# Patient Record
Sex: Female | Born: 1975 | Race: White | Hispanic: No | Marital: Married | Smoking: Current every day smoker
Health system: Southern US, Community
[De-identification: ages and names within clinical notes are randomized; demographics above are authoritative.]

## PROBLEM LIST (undated history)

## (undated) DIAGNOSIS — H538 Other visual disturbances: Secondary | ICD-10-CM

## (undated) DIAGNOSIS — C801 Malignant (primary) neoplasm, unspecified: Secondary | ICD-10-CM

## (undated) DIAGNOSIS — R0602 Shortness of breath: Secondary | ICD-10-CM

## (undated) DIAGNOSIS — G479 Sleep disorder, unspecified: Secondary | ICD-10-CM

## (undated) DIAGNOSIS — R4189 Other symptoms and signs involving cognitive functions and awareness: Secondary | ICD-10-CM

## (undated) DIAGNOSIS — R0600 Dyspnea, unspecified: Secondary | ICD-10-CM

## (undated) DIAGNOSIS — N63 Unspecified lump in unspecified breast: Secondary | ICD-10-CM

## (undated) DIAGNOSIS — M25511 Pain in right shoulder: Secondary | ICD-10-CM

## (undated) DIAGNOSIS — G43411 Hemiplegic migraine, intractable, with status migrainosus: Secondary | ICD-10-CM

## (undated) DIAGNOSIS — L680 Hirsutism: Secondary | ICD-10-CM

## (undated) DIAGNOSIS — R002 Palpitations: Secondary | ICD-10-CM

## (undated) DIAGNOSIS — R5383 Other fatigue: Secondary | ICD-10-CM

## (undated) DIAGNOSIS — R6889 Other general symptoms and signs: Secondary | ICD-10-CM

## (undated) DIAGNOSIS — H53453 Other localized visual field defect, bilateral: Secondary | ICD-10-CM

## (undated) DIAGNOSIS — G43E11 Chronic migraine with aura, intractable, with status migrainosus: Secondary | ICD-10-CM

## (undated) DIAGNOSIS — M7581 Other shoulder lesions, right shoulder: Secondary | ICD-10-CM

## (undated) DIAGNOSIS — M7501 Adhesive capsulitis of right shoulder: Secondary | ICD-10-CM

## (undated) HISTORY — PX: LAPAROSCOPIC GASTRIC SLEEVE RESECTION: SHX5895

## (undated) HISTORY — PX: ABDOMINAL HYSTERECTOMY: SHX81

## (undated) HISTORY — PX: ABDOMINAL SURGERY: SHX537

---

## 2006-07-22 ENCOUNTER — Ambulatory Visit: Payer: Self-pay | Admitting: Gastroenterology

## 2007-07-02 ENCOUNTER — Ambulatory Visit: Payer: Self-pay | Admitting: Family Medicine

## 2007-07-08 ENCOUNTER — Ambulatory Visit: Payer: Self-pay | Admitting: Family Medicine

## 2007-10-08 ENCOUNTER — Ambulatory Visit: Payer: Self-pay | Admitting: Obstetrics and Gynecology

## 2008-01-29 ENCOUNTER — Emergency Department: Payer: Self-pay | Admitting: Emergency Medicine

## 2008-10-13 ENCOUNTER — Ambulatory Visit: Payer: Self-pay | Admitting: Neurology

## 2009-06-08 ENCOUNTER — Ambulatory Visit: Payer: Self-pay | Admitting: Urology

## 2012-05-21 ENCOUNTER — Ambulatory Visit: Payer: Self-pay | Admitting: Internal Medicine

## 2014-08-25 ENCOUNTER — Other Ambulatory Visit: Payer: Self-pay | Admitting: Obstetrics and Gynecology

## 2014-08-25 DIAGNOSIS — Z1231 Encounter for screening mammogram for malignant neoplasm of breast: Secondary | ICD-10-CM

## 2014-09-02 ENCOUNTER — Ambulatory Visit
Admission: RE | Admit: 2014-09-02 | Discharge: 2014-09-02 | Disposition: A | Discharge status: Home / Self Care | Payer: BLUE CROSS/BLUE SHIELD | Source: Ambulatory Visit | Attending: Obstetrics and Gynecology | Admitting: Obstetrics and Gynecology

## 2014-09-02 DIAGNOSIS — Z1231 Encounter for screening mammogram for malignant neoplasm of breast: Secondary | ICD-10-CM | POA: Insufficient documentation

## 2017-06-20 ENCOUNTER — Other Ambulatory Visit: Payer: Self-pay

## 2017-06-20 ENCOUNTER — Encounter: Payer: Self-pay | Admitting: *Deleted

## 2017-06-20 ENCOUNTER — Emergency Department
Admission: EM | Admit: 2017-06-20 | Discharge: 2017-06-20 | Disposition: A | Discharge status: Home / Self Care | Payer: BLUE CROSS/BLUE SHIELD | Attending: Emergency Medicine | Admitting: Emergency Medicine

## 2017-06-20 ENCOUNTER — Emergency Department: Payer: BLUE CROSS/BLUE SHIELD

## 2017-06-20 DIAGNOSIS — F17210 Nicotine dependence, cigarettes, uncomplicated: Secondary | ICD-10-CM | POA: Diagnosis not present

## 2017-06-20 DIAGNOSIS — R079 Chest pain, unspecified: Secondary | ICD-10-CM | POA: Diagnosis present

## 2017-06-20 DIAGNOSIS — M79601 Pain in right arm: Secondary | ICD-10-CM | POA: Diagnosis not present

## 2017-06-20 LAB — BASIC METABOLIC PANEL
Anion gap: 7 (ref 5–15)
BUN: 12 mg/dL (ref 6–20)
CALCIUM: 8.7 mg/dL — AB (ref 8.9–10.3)
CO2: 23 mmol/L (ref 22–32)
CREATININE: 0.72 mg/dL (ref 0.44–1.00)
Chloride: 109 mmol/L (ref 101–111)
GFR calc Af Amer: >60 mL/min (ref >60)
GFR calc non Af Amer: >60 mL/min (ref >60)
GLUCOSE: 91 mg/dL (ref 65–99)
Potassium: 4.2 mmol/L (ref 3.5–5.1)
SODIUM: 139 mmol/L (ref 135–145)

## 2017-06-20 LAB — CBC
HCT: 42.5 % (ref 35.0–47.0)
Hemoglobin: 14.5 g/dL (ref 12.0–16.0)
MCH: 33.2 pg (ref 26.0–34.0)
MCHC: 34 g/dL (ref 32.0–36.0)
MCV: 97.5 fL (ref 80.0–100.0)
PLATELETS: 233 10*3/uL (ref 150–440)
RBC: 4.36 MIL/uL (ref 3.80–5.20)
RDW: 12.7 % (ref 11.5–14.5)
WBC: 7 10*3/uL (ref 3.6–11.0)

## 2017-06-20 LAB — TROPONIN I: Troponin I: <0.03 ng/mL (ref <0.03)

## 2017-06-20 MED ORDER — KETOROLAC TROMETHAMINE 10 MG PO TABS
10.0000 mg | ORAL_TABLET | Freq: Once | ORAL | Status: AC
  Administered 2017-06-20: 10 mg via ORAL
  Filled 2017-06-20: qty 1

## 2017-06-20 NOTE — Discharge Instructions (Addendum)
For your chest pain, you may take Tylenol or Motrin.  In addition, you may use ice or heat that you apply for 15 minutes every 2 hours when you are awake.  Please return to the emergency department if you develop severe pain, lightheadedness or fainting, nausea or vomiting, fever, or any other symptoms concerning to you.

## 2017-06-20 NOTE — ED Triage Notes (Signed)
Pt reports chest pain starting at work. Starting w/ R arm pain that radiated to chest. Pt c/o presently of R sided headache and dizziness. Pt states this has progressed throughout the day. 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 Pt states she has had a "cold" for several weeks. Pt is in no acute distress at this time but is slightly tachypnic intermittently.

## 2017-06-20 NOTE — ED Provider Notes (Signed)
[facility] Emergency Department Provider Note  ____________________________________________  Time seen: Approximately 4:17 PM  I have reviewed the triage vital signs and the nursing notes.   HISTORY  Chief Complaint Chest Pain    HPI Faith Hall is a 42 y.o. female ongoing tobacco abuse presenting with chest pain.  The patient reports that she was at work sitting at a desk when she developed a mid forearm "throbbing" which then radiated up to the remainder of the right arm and the right chest and became sharp.  The pain is better if she pushes on her chest.  It is not worse with deep breaths and she has no associated shortness of breath, palpitations, diaphoresis, lightheadedness or syncope, nausea or vomiting.  She did not try anything for her pain.  She has not recently had any strain or trauma.  No lower extremity swelling or calf pain although she recently did travel to [place].  No personal or family history of blood clots.  SH: As of tobacco, denies cocaine.  FH: Grandparents with stroke and MI.  History reviewed. No pertinent past medical history.  There are no active problems to display for this patient.   Past Surgical History:  Procedure Laterality Date  . ABDOMINAL HYSTERECTOMY    . ABDOMINAL SURGERY        Allergies Amoxicillin and Penicillins  Family History  Problem Relation Age of Onset  . Breast cancer Maternal Grandmother 45  . Lung cancer Maternal Grandfather 36  . Breast cancer Paternal Grandmother 57  . Ovarian cancer Paternal Grandmother 110    Social History Social History   Tobacco Use  . Smoking status: Current Every Day Smoker    Types: Cigarettes  . Smokeless tobacco: Never Used  Substance Use Topics  . Alcohol use: Not Currently    Frequency: Never    Comment: occasionally  . Drug use: Never    Review of Systems Constitutional: No fever/chills.  No lightheadedness or syncope.  No diaphoresis.  No general  malaise. Eyes: No visual changes.  No eye discharge. ENT: No sore throat. No congestion or rhinorrhea. Cardiovascular: Positive right-sided chest pain. Denies palpitations. Respiratory: Denies shortness of breath.  No cough. Gastrointestinal: No abdominal pain.  No nausea, no vomiting.  No diarrhea.  No constipation. Genitourinary: Negative for dysuria. Musculoskeletal: Negative for back pain.  Positive right upper extremity pain.  No lower extremity swelling or calf pain. Skin: Negative for rash. Neurological: Negative for headaches. No focal numbness, tingling or weakness.     ____________________________________________   PHYSICAL EXAM:  VITAL SIGNS: ED Triage Vitals  Enc Vitals Group     BP 06/20/17 1521 116/71     Pulse Rate 06/20/17 1521 82     Resp 06/20/17 1521 (!) 23     Temp 06/20/17 1521 98.8 F (37.1 C)     Temp Source 06/20/17 1521 Oral     SpO2 06/20/17 1521 99 %     Weight 06/20/17 1524 125 lb (56.7 kg)     Height 06/20/17 1524 5\' 5"  (1.651 m)     Head Circumference --      Peak Flow --      Pain Score 06/20/17 1522 6     Pain Loc --      Pain Edu? --      Excl. in [place]? --     Constitutional: Alert and oriented. Well appearing and in no acute distress. Answers questions appropriately. Eyes: Conjunctivae are normal.  EOMI.  No scleral icterus. Head: Atraumatic. Nose: No congestion/rhinnorhea. Mouth/Throat: Mucous membranes are moist.  Neck: No stridor.  Supple.  No JVD.  No meningismus. CHEST WALL: The chest wall is normal in appearance without any skin changes.  I am able to "ease" the pain the patient is experiencing when I push on the right upper chest approximately 2-3 inches below the clavicle.  There is no rib instability or crepitus. Cardiovascular: Normal rate, regular rhythm. No murmurs, rubs or gallops.  Respiratory: Normal respiratory effort.  No accessory muscle use or retractions. Lungs CTAB.  No wheezes, rales or ronchi. Gastrointestinal: Soft,  nontender and nondistended.  No guarding or rebound.  No peritoneal signs. Musculoskeletal: No LE edema. No ttp in the calves or palpable cords.  Negative Homan's sign. Neurologic:  A&Ox3.  Speech is clear.  Face and smile are symmetric.  EOMI.  Moves all extremities well. Skin:  Skin is warm, dry and intact. No rash noted. Psychiatric: Mood and affect are normal. Speech and behavior are normal.  Normal judgement.  ____________________________________________   LABS (all labs ordered are listed, but only abnormal results are displayed)  Labs Reviewed  BASIC METABOLIC PANEL - Abnormal; Notable for the following components:      Result Value   Calcium 8.7 (*)    All other components within normal limits  CBC  TROPONIN I  POC URINE PREG, ED   ____________________________________________  EKG  ED ECG REPORT I, Eula Listen, the attending physician, personally viewed and interpreted this ECG.   Date: 06/20/2017  EKG Time: 1520  Rate: 82  Rhythm: normal sinus rhythm  Axis: normal  Intervals:none  ST&T Change: Specific T wave inversion in V1.  No STEMI.  No evidence of Brugada syndrome, hypertrophy, or prolonged QTC  ____________________________________________  RADIOLOGY  Dg Chest 2 View  Result Date: 06/20/2017 CLINICAL DATA:  Chest pain. EXAM: CHEST - 2 VIEW COMPARISON:  None. FINDINGS: The heart size and mediastinal contours are within normal limits. Both lungs are clear. The visualized skeletal structures are unremarkable. IMPRESSION: Normal chest x-ray. Electronically Signed   By: Titus Dubin M.D.   On: 06/20/2017 15:58    ____________________________________________   PROCEDURES  Procedure(s) performed: None  Procedures  Critical Care performed: No ____________________________________________   INITIAL IMPRESSION / ASSESSMENT AND PLAN / ED COURSE  Pertinent labs & imaging results that were available during my care of the patient were reviewed by  me and considered in my medical decision making (see chart for details).  42 y.o. female with right arm pain and right-sided chest pain which started around 9 AM today, and improves if she pushes on her chest.  Overall, the patient is hemodynamically stable.  This chest pain is atypical for ACS or MI in her workup in the emergency department has been reassuring with an EKG that does not show any ischemic changes and normal vital signs.  I am awaiting the results of her troponin, which will be drawn well after the 4-hour mark from the initiation of symptoms.  We will treat the patient with Toradol.  A PE is considered but very unlikely given no tachycardia, hypoxia, evidence of DVT, although the patient is noted to be a smoker and have had some recent travel.  A GI cause is also possible.  If the patient's laboratory studies are reassuring, anticipate discharge home with close PMD follow-up.  ____________________________________________  FINAL CLINICAL IMPRESSION(S) / ED DIAGNOSES  Final diagnoses:  Right-sided chest pain  Right arm pain  NEW MEDICATIONS STARTED DURING THIS VISIT:  New Prescriptions   No medications on file      Eula Listen, MD 06/20/17 1726

## 2017-08-23 ENCOUNTER — Other Ambulatory Visit: Payer: Self-pay | Admitting: Obstetrics and Gynecology

## 2017-08-23 DIAGNOSIS — Z1231 Encounter for screening mammogram for malignant neoplasm of breast: Secondary | ICD-10-CM

## 2017-08-28 ENCOUNTER — Ambulatory Visit
Admission: RE | Admit: 2017-08-28 | Discharge: 2017-08-28 | Disposition: A | Discharge status: Home / Self Care | Payer: BLUE CROSS/BLUE SHIELD | Source: Ambulatory Visit | Attending: Obstetrics and Gynecology | Admitting: Obstetrics and Gynecology

## 2017-08-28 ENCOUNTER — Encounter (INDEPENDENT_AMBULATORY_CARE_PROVIDER_SITE_OTHER): Payer: Self-pay

## 2017-08-28 DIAGNOSIS — Z1231 Encounter for screening mammogram for malignant neoplasm of breast: Secondary | ICD-10-CM | POA: Insufficient documentation

## 2017-08-28 HISTORY — DX: Malignant (primary) neoplasm, unspecified: C80.1

## 2018-09-09 ENCOUNTER — Other Ambulatory Visit: Payer: Self-pay | Admitting: Obstetrics and Gynecology

## 2018-09-09 DIAGNOSIS — Z1231 Encounter for screening mammogram for malignant neoplasm of breast: Secondary | ICD-10-CM

## 2018-09-10 ENCOUNTER — Other Ambulatory Visit: Payer: Self-pay

## 2018-09-10 ENCOUNTER — Ambulatory Visit
Admission: RE | Admit: 2018-09-10 | Discharge: 2018-09-10 | Disposition: A | Discharge status: Home / Self Care | Payer: BC Managed Care – PPO | Source: Ambulatory Visit | Attending: Obstetrics and Gynecology | Admitting: Obstetrics and Gynecology

## 2018-09-10 DIAGNOSIS — Z1231 Encounter for screening mammogram for malignant neoplasm of breast: Secondary | ICD-10-CM | POA: Diagnosis present

## 2019-05-27 ENCOUNTER — Other Ambulatory Visit: Payer: Self-pay | Admitting: Neurology

## 2019-05-27 ENCOUNTER — Other Ambulatory Visit (HOSPITAL_COMMUNITY): Payer: Self-pay | Admitting: Neurology

## 2019-05-27 DIAGNOSIS — H539 Unspecified visual disturbance: Secondary | ICD-10-CM

## 2019-05-27 DIAGNOSIS — R413 Other amnesia: Secondary | ICD-10-CM

## 2019-05-27 DIAGNOSIS — R519 Headache, unspecified: Secondary | ICD-10-CM

## 2019-06-03 ENCOUNTER — Ambulatory Visit
Admission: RE | Admit: 2019-06-03 | Discharge: 2019-06-03 | Disposition: A | Discharge status: Home / Self Care | Payer: BC Managed Care – PPO | Source: Ambulatory Visit | Attending: Neurology | Admitting: Neurology

## 2019-06-03 ENCOUNTER — Other Ambulatory Visit: Payer: Self-pay

## 2019-06-03 DIAGNOSIS — R519 Headache, unspecified: Secondary | ICD-10-CM | POA: Insufficient documentation

## 2019-06-03 DIAGNOSIS — H539 Unspecified visual disturbance: Secondary | ICD-10-CM | POA: Diagnosis present

## 2019-06-03 DIAGNOSIS — R413 Other amnesia: Secondary | ICD-10-CM

## 2019-06-25 ENCOUNTER — Ambulatory Visit: Payer: BC Managed Care – PPO | Attending: Internal Medicine

## 2019-06-25 DIAGNOSIS — Z23 Encounter for immunization: Secondary | ICD-10-CM

## 2019-06-25 NOTE — Progress Notes (Signed)
   Covid-19 Vaccination Clinic  Name:  Faith Hall    MRN: [id number] DOB: 1975/11/19  06/25/2019  [name] was observed post Covid-19 immunization for 15 minutes without incident. She was provided with Vaccine Information Sheet and instruction to access the V-Safe system.   [name] was instructed to call 911 with any severe reactions post vaccine: Marland Kitchen Difficulty breathing  . Swelling of face and throat  . A fast heartbeat  . A bad rash all over body  . Dizziness and weakness   Immunizations Administered    Name Date Dose VIS Date Route   Pfizer COVID-19 Vaccine 06/25/2019 11:14 AM 0.3 mL 03/06/2019 Intramuscular   Manufacturer: [place]   Lot: [id number]   [place]: [id number]

## 2019-07-21 ENCOUNTER — Ambulatory Visit: Payer: BC Managed Care – PPO | Attending: Internal Medicine

## 2019-07-21 DIAGNOSIS — Z23 Encounter for immunization: Secondary | ICD-10-CM

## 2019-07-21 NOTE — Progress Notes (Signed)
   Covid-19 Vaccination Clinic  Name:  Faith Hall    MRN: [id number] DOB: 04-04-1975  07/21/2019  [name] was observed post Covid-19 immunization for 15 minutes without incident. She was provided with Vaccine Information Sheet and instruction to access the V-Safe system.   [name] was instructed to call 911 with any severe reactions post vaccine: Marland Kitchen Difficulty breathing  . Swelling of face and throat  . A fast heartbeat  . A bad rash all over body  . Dizziness and weakness   Immunizations Administered    Name Date Dose VIS Date Route   Pfizer COVID-19 Vaccine 07/21/2019  3:13 PM 0.3 mL 05/20/2018 Intramuscular   Manufacturer: [place]   Lot: [id number]   [place]: [id number]

## 2019-07-21 NOTE — Progress Notes (Signed)
   Covid-19 Vaccination Clinic  Name:  Faith Hall    MRN: [id number] DOB: 14-Sep-1975  07/21/2019  Faith Hall was observed post Covid-19 immunization for 15 minutes .  During the observation period, she experienced an adverse reaction with the following symptoms:  itching to face and neck.  Assessment : Time of assessment Alert and oriented, Anxious and itching to face and neck. No rash seen by this writer  Actions taken:  Vitals sign taken  VAERS form obtained and completed by RN. VS taken at 1532- BP- 129/85, P- 73, O2- 100%, Water given.    Medications administered: Loratadine (Claritin) 10 mg  by mouth. Time: 1532. Administered by RN. Famotidine (Pepcid) 20 mg by mouth. Time: 1532. Administered by RN.  Disposition: Reports no further symptoms of adverse reaction after observation for 40 minutes. Discharged home. Instructed to call 911 for trouble breathing, rapid heart rate, dizziness, swelling of tongue or throat. The Patient was provided with Vaccine Information Sheet and instruction to access the V-Safe system.    Immunizations Administered    Name Date Dose VIS Date Route   Pfizer COVID-19 Vaccine 07/21/2019  3:13 PM 0.3 mL 05/20/2018 Intramuscular   Manufacturer: [place]   Lot: [id number]   [place]: [id number]

## 2019-07-23 ENCOUNTER — Telehealth: Payer: Self-pay | Admitting: *Deleted

## 2019-07-23 ENCOUNTER — Encounter: Payer: Self-pay | Admitting: *Deleted

## 2019-07-23 NOTE — Telephone Encounter (Signed)
Called and left a voicemail for the patient asking for her to return call to discuss the second COVID vaccine that she received and to call back with an update. MyChart message sent. Will follow up.

## 2019-07-24 NOTE — Telephone Encounter (Signed)
Letter mailed

## 2019-12-30 ENCOUNTER — Other Ambulatory Visit (HOSPITAL_COMMUNITY): Payer: Self-pay | Admitting: Obstetrics and Gynecology

## 2019-12-30 DIAGNOSIS — Z1231 Encounter for screening mammogram for malignant neoplasm of breast: Secondary | ICD-10-CM

## 2020-01-07 ENCOUNTER — Other Ambulatory Visit: Payer: Self-pay

## 2020-01-07 ENCOUNTER — Ambulatory Visit (HOSPITAL_COMMUNITY): Payer: BC Managed Care – PPO

## 2020-01-07 ENCOUNTER — Other Ambulatory Visit: Payer: Self-pay | Admitting: Obstetrics and Gynecology

## 2020-01-07 ENCOUNTER — Ambulatory Visit
Admission: RE | Admit: 2020-01-07 | Discharge: 2020-01-07 | Disposition: A | Discharge status: Home / Self Care | Payer: BC Managed Care – PPO | Source: Ambulatory Visit | Attending: Obstetrics and Gynecology | Admitting: Obstetrics and Gynecology

## 2020-01-07 DIAGNOSIS — Z1231 Encounter for screening mammogram for malignant neoplasm of breast: Secondary | ICD-10-CM | POA: Insufficient documentation

## 2021-07-06 ENCOUNTER — Other Ambulatory Visit: Payer: Self-pay | Admitting: Obstetrics and Gynecology

## 2021-07-06 DIAGNOSIS — Z1231 Encounter for screening mammogram for malignant neoplasm of breast: Secondary | ICD-10-CM

## 2021-07-21 ENCOUNTER — Other Ambulatory Visit: Payer: Self-pay | Admitting: Physician Assistant

## 2021-07-21 DIAGNOSIS — R202 Paresthesia of skin: Secondary | ICD-10-CM

## 2021-07-21 DIAGNOSIS — R413 Other amnesia: Secondary | ICD-10-CM

## 2021-07-21 DIAGNOSIS — R531 Weakness: Secondary | ICD-10-CM

## 2021-07-21 DIAGNOSIS — R519 Headache, unspecified: Secondary | ICD-10-CM

## 2021-07-21 DIAGNOSIS — G43411 Hemiplegic migraine, intractable, with status migrainosus: Secondary | ICD-10-CM

## 2021-07-21 DIAGNOSIS — H539 Unspecified visual disturbance: Secondary | ICD-10-CM

## 2021-07-21 DIAGNOSIS — H538 Other visual disturbances: Secondary | ICD-10-CM

## 2021-08-09 ENCOUNTER — Ambulatory Visit
Admission: RE | Admit: 2021-08-09 | Discharge: 2021-08-09 | Disposition: A | Discharge status: Home / Self Care | Payer: BC Managed Care – PPO | Source: Ambulatory Visit | Attending: Obstetrics and Gynecology | Admitting: Obstetrics and Gynecology

## 2021-08-09 DIAGNOSIS — Z1231 Encounter for screening mammogram for malignant neoplasm of breast: Secondary | ICD-10-CM | POA: Insufficient documentation

## 2021-10-02 ENCOUNTER — Other Ambulatory Visit: Payer: Self-pay | Admitting: Physician Assistant

## 2021-10-02 DIAGNOSIS — R519 Headache, unspecified: Secondary | ICD-10-CM

## 2021-10-02 DIAGNOSIS — H53453 Other localized visual field defect, bilateral: Secondary | ICD-10-CM

## 2021-10-11 ENCOUNTER — Ambulatory Visit
Admission: RE | Admit: 2021-10-11 | Discharge: 2021-10-11 | Disposition: A | Discharge status: Home / Self Care | Payer: BC Managed Care – PPO | Source: Ambulatory Visit | Attending: Physician Assistant | Admitting: Physician Assistant

## 2021-10-11 DIAGNOSIS — H53453 Other localized visual field defect, bilateral: Secondary | ICD-10-CM | POA: Diagnosis present

## 2021-10-11 DIAGNOSIS — R519 Headache, unspecified: Secondary | ICD-10-CM | POA: Insufficient documentation

## 2021-10-11 MED ORDER — GADOBUTROL 1 MMOL/ML IV SOLN
5.0000 mL | Freq: Once | INTRAVENOUS | Status: AC | PRN
  Administered 2021-10-11: 5 mL via INTRAVENOUS

## 2022-05-31 ENCOUNTER — Other Ambulatory Visit: Payer: Self-pay | Admitting: Obstetrics and Gynecology

## 2022-05-31 DIAGNOSIS — N63 Unspecified lump in unspecified breast: Secondary | ICD-10-CM

## 2022-06-06 ENCOUNTER — Ambulatory Visit
Admission: RE | Admit: 2022-06-06 | Discharge: 2022-06-06 | Disposition: A | Discharge status: Home / Self Care | Payer: BC Managed Care – PPO | Source: Ambulatory Visit | Attending: Obstetrics and Gynecology | Admitting: Obstetrics and Gynecology

## 2022-06-06 DIAGNOSIS — N63 Unspecified lump in unspecified breast: Secondary | ICD-10-CM | POA: Diagnosis present

## 2022-08-21 ENCOUNTER — Ambulatory Visit: Payer: BC Managed Care – PPO

## 2022-08-21 DIAGNOSIS — Z1211 Encounter for screening for malignant neoplasm of colon: Secondary | ICD-10-CM

## 2022-08-21 DIAGNOSIS — K640 First degree hemorrhoids: Secondary | ICD-10-CM

## 2022-12-10 IMAGING — MG MM DIGITAL SCREENING BILAT W/ TOMO AND CAD
6 of 10 series · 6 of 30 positions shown · non-contrast
Comparison: Previous exam(s).

CLINICAL DATA: Screening.

EXAM:
DIGITAL SCREENING BILATERAL MAMMOGRAM WITH TOMOSYNTHESIS AND CAD
TECHNIQUE: Bilateral screening digital craniocaudal and mediolateral oblique
mammograms were obtained. Bilateral screening digital breast
tomosynthesis was performed. The images were evaluated with
computer-aided detection.

[R MLO synth-2D (1 of 2)]
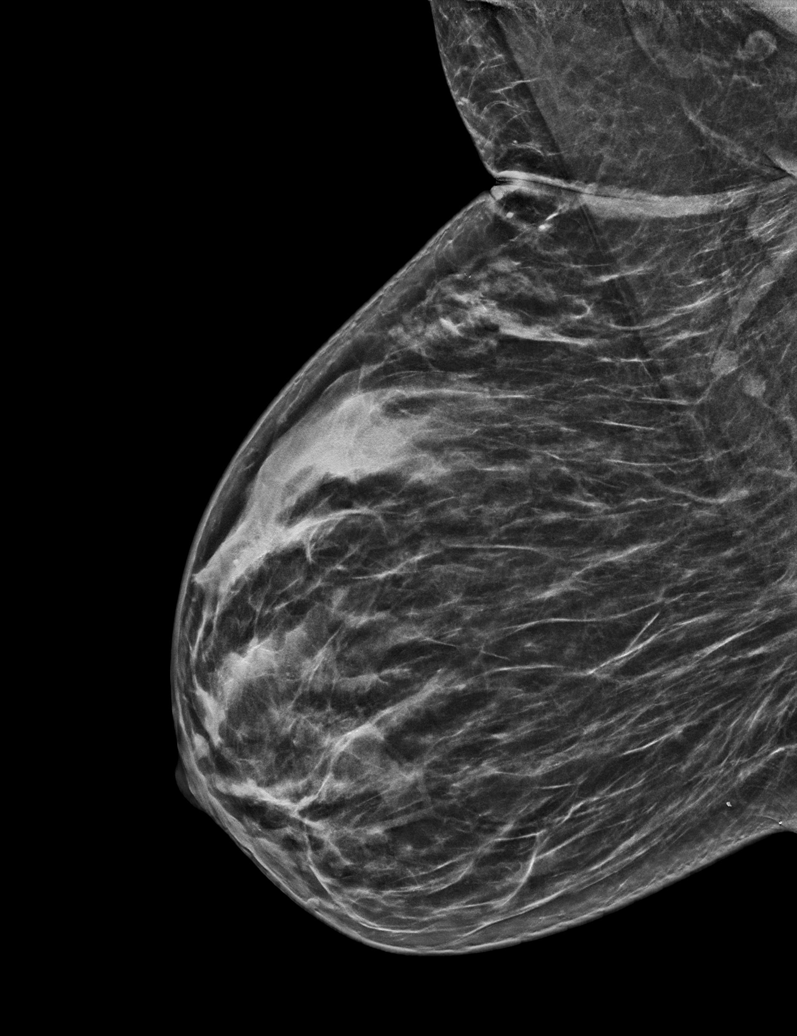

[R CC synth-2D]
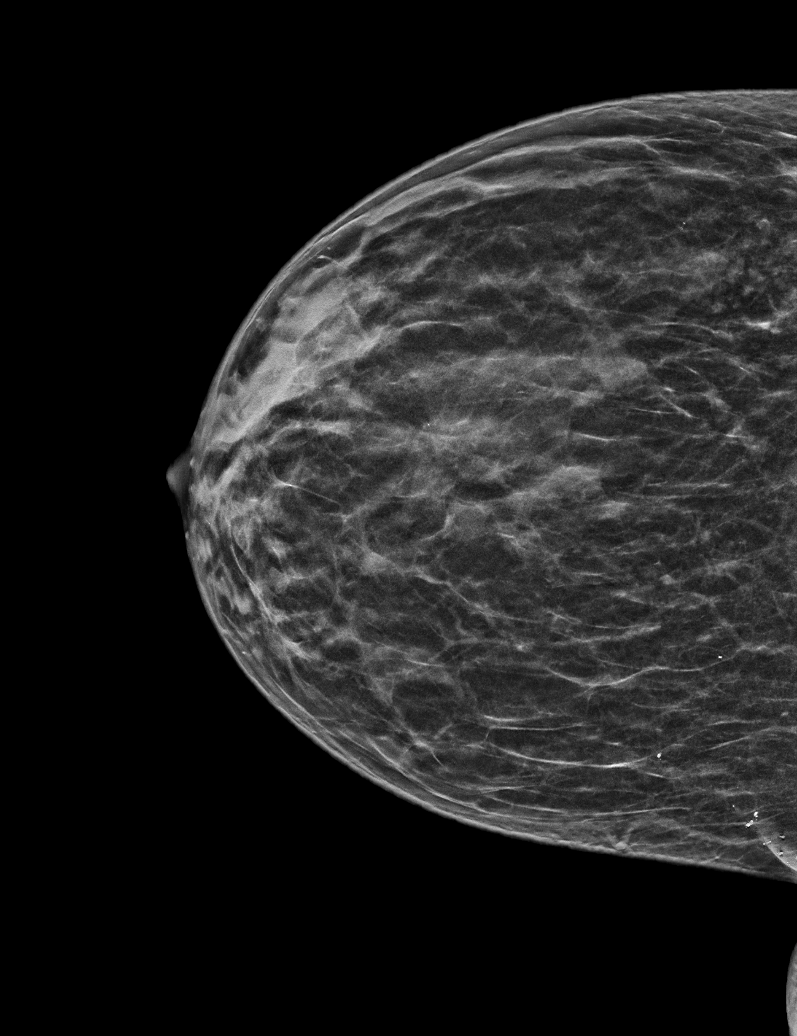

[L CC synth-2D]
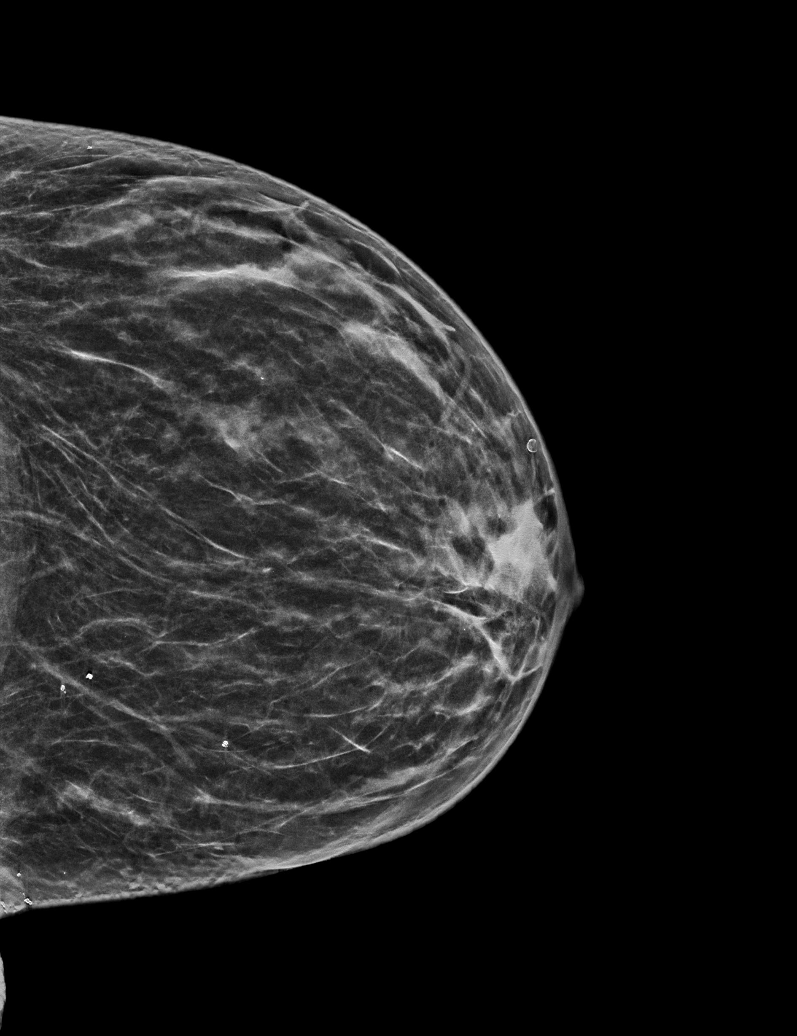

[R MLO synth-2D (2 of 2)]
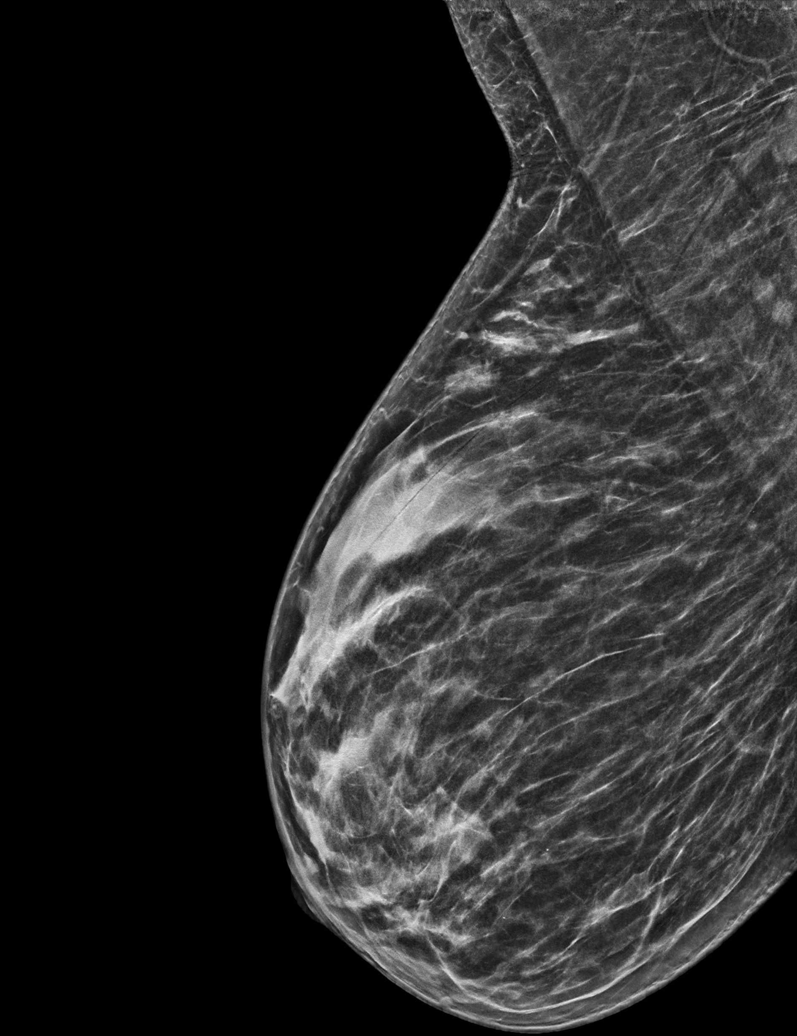

[L MLO synth-2D]
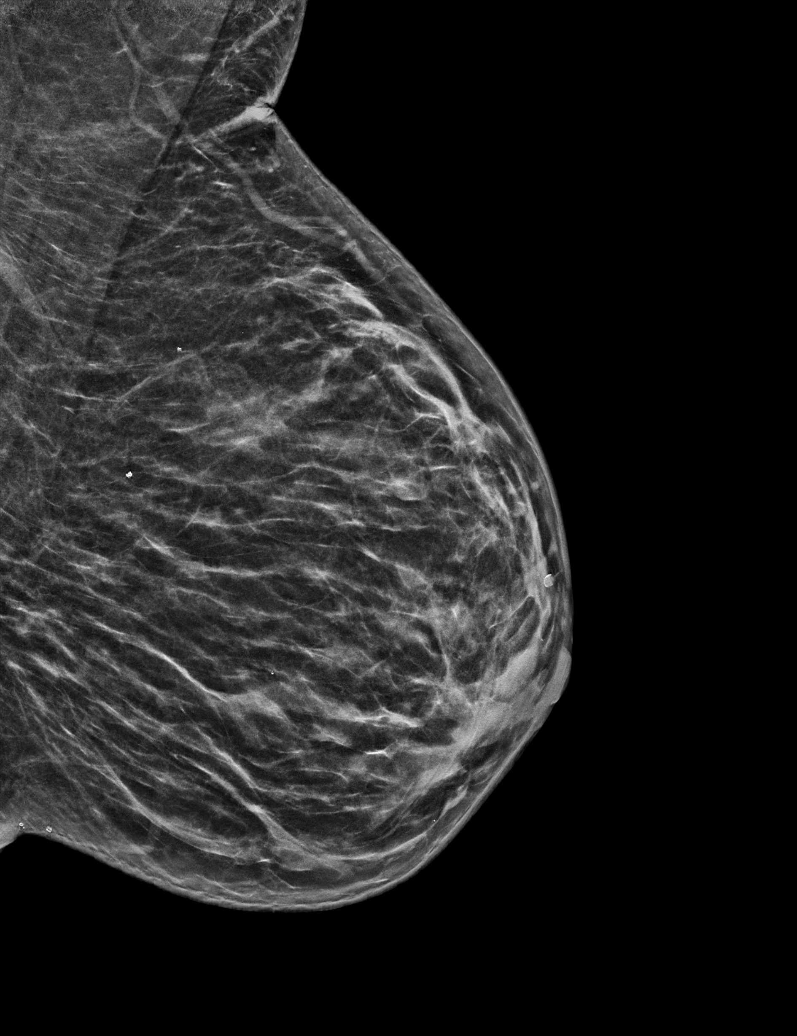

[R MLO tomo · tomo slice 28/55.0]
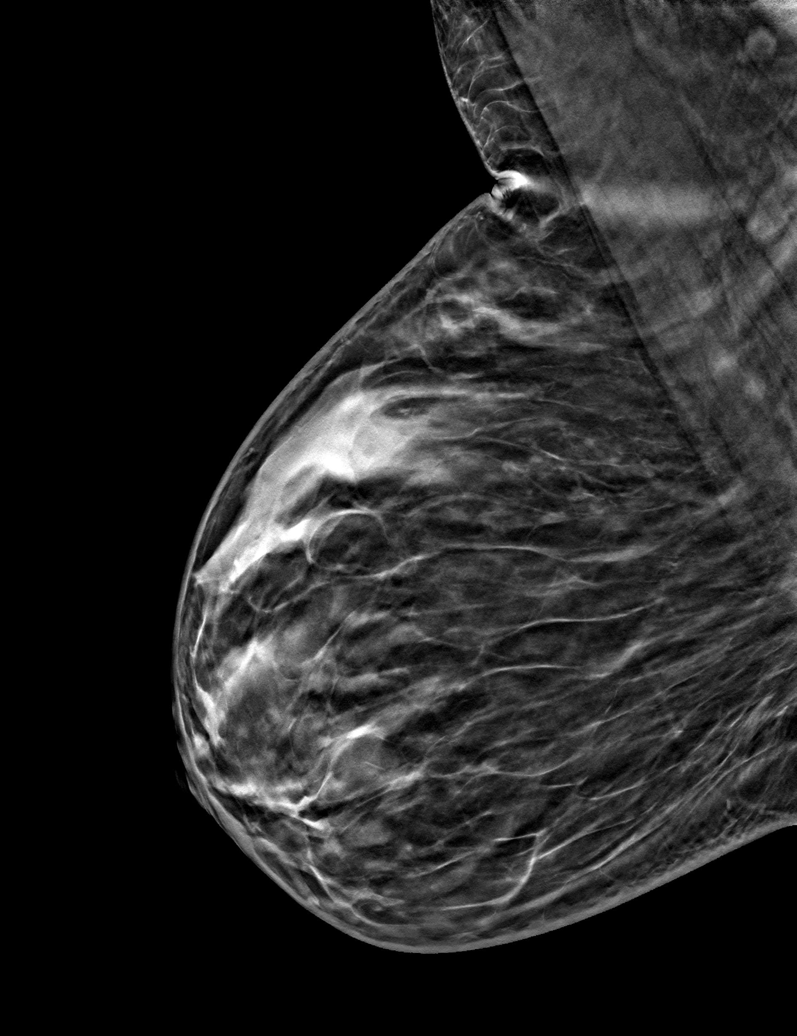

[6 of 30 positions shown; findings below may reference images not displayed]

ACR Breast Density Category c: The breast tissue is heterogeneously
dense, which may obscure small masses.
FINDINGS: There are no findings suspicious for malignancy.
IMPRESSION: No mammographic evidence of malignancy. A result letter of this
screening mammogram will be mailed directly to the patient.

RECOMMENDATION:
Screening mammogram in one year. (Code:[id number])

BI-RADS CATEGORY  1: Negative.

## 2023-02-06 ENCOUNTER — Other Ambulatory Visit: Payer: Self-pay | Admitting: Physician Assistant

## 2023-02-06 DIAGNOSIS — H539 Unspecified visual disturbance: Secondary | ICD-10-CM

## 2023-02-06 DIAGNOSIS — G43411 Hemiplegic migraine, intractable, with status migrainosus: Secondary | ICD-10-CM

## 2023-02-06 DIAGNOSIS — H538 Other visual disturbances: Secondary | ICD-10-CM

## 2023-02-06 DIAGNOSIS — R202 Paresthesia of skin: Secondary | ICD-10-CM

## 2023-02-06 DIAGNOSIS — H53453 Other localized visual field defect, bilateral: Secondary | ICD-10-CM

## 2023-02-26 ENCOUNTER — Ambulatory Visit
Admission: RE | Admit: 2023-02-26 | Discharge: 2023-02-26 | Disposition: A | Discharge status: Home / Self Care | Payer: BC Managed Care – PPO | Source: Ambulatory Visit | Attending: Physician Assistant | Admitting: Physician Assistant

## 2023-02-26 DIAGNOSIS — H539 Unspecified visual disturbance: Secondary | ICD-10-CM

## 2023-02-26 DIAGNOSIS — H53453 Other localized visual field defect, bilateral: Secondary | ICD-10-CM

## 2023-02-26 DIAGNOSIS — R202 Paresthesia of skin: Secondary | ICD-10-CM

## 2023-02-26 DIAGNOSIS — G43411 Hemiplegic migraine, intractable, with status migrainosus: Secondary | ICD-10-CM

## 2023-02-26 DIAGNOSIS — H538 Other visual disturbances: Secondary | ICD-10-CM

## 2023-07-23 ENCOUNTER — Other Ambulatory Visit: Payer: Self-pay | Admitting: Surgery

## 2023-07-26 ENCOUNTER — Encounter
Admission: RE | Admit: 2023-07-26 | Discharge: 2023-07-26 | Disposition: A | Discharge status: Home / Self Care | Source: Ambulatory Visit | Attending: Surgery

## 2023-07-26 ENCOUNTER — Other Ambulatory Visit: Payer: Self-pay

## 2023-07-26 HISTORY — DX: Other general symptoms and signs: R68.89

## 2023-07-26 HISTORY — DX: Hemiplegic migraine, intractable, with status migrainosus: G43.411

## 2023-07-26 HISTORY — DX: Hirsutism: L68.0

## 2023-07-26 HISTORY — DX: Sleep disorder, unspecified: G47.9

## 2023-07-26 HISTORY — DX: Other symptoms and signs involving cognitive functions and awareness: R41.89

## 2023-07-26 HISTORY — DX: Other shoulder lesions, right shoulder: M75.81

## 2023-07-26 HISTORY — DX: Pain in right shoulder: M25.511

## 2023-07-26 HISTORY — DX: Other localized visual field defect, bilateral: H53.453

## 2023-07-26 HISTORY — DX: Chronic migraine with aura, intractable, with status migrainosus: G43.E11

## 2023-07-26 HISTORY — DX: Unspecified lump in unspecified breast: N63.0

## 2023-07-26 HISTORY — DX: Other fatigue: R53.83

## 2023-07-26 HISTORY — DX: Adhesive capsulitis of right shoulder: M75.01

## 2023-07-26 HISTORY — DX: Palpitations: R00.2

## 2023-07-26 HISTORY — DX: Dyspnea, unspecified: R06.00

## 2023-07-26 HISTORY — DX: Shortness of breath: R06.02

## 2023-07-26 HISTORY — DX: Other visual disturbances: H53.8

## 2023-07-26 NOTE — Patient Instructions (Addendum)
 Your procedure is scheduled on: 07/31/23 - Wednesday Report to the Registration Desk on the 1st floor of the [facility]. To find out your arrival time, please call [phone] between 1PM - 3PM on: 07/30/23 - Tuesday If your arrival time is 6:00 am, do not arrive before that time as the [facility] entrance doors do not open until 6:00 am.  REMEMBER: Instructions that are not followed completely may result in serious medical risk, up to and including death; or upon the discretion of your surgeon and anesthesiologist your surgery may need to be rescheduled.  Do not eat food after midnight the night before surgery.  No gum chewing or hard candies.  You may however, drink CLEAR liquids up to 2 hours before you are scheduled to arrive for your surgery. Do not drink anything within 2 hours of your scheduled arrival time.  Clear liquids include: - water  - apple juice without pulp - gatorade (not RED colors) - black coffee or tea (Do NOT add milk or creamers to the coffee or tea) Do NOT drink anything that is not on this list.  In addition, your doctor has ordered for you to drink the provided:  Ensure Pre-Surgery Clear Carbohydrate Drink  Drinking this carbohydrate drink up to two hours before surgery helps to reduce insulin resistance and improve patient outcomes. Please complete drinking 2 hours before scheduled arrival time.  One week prior to surgery: Stop Anti-inflammatories (NSAIDS) such as Advil, Aleve, Ibuprofen, Motrin, Naproxen, Naprosyn and Aspirin based products such as Excedrin, Goody's Powder, BC Powder. You take Tylenol if needed for pain up until the day of surgery.  Stop ANY OVER THE COUNTER supplements until after surgery.  ON THE DAY OF SURGERY ONLY TAKE THESE MEDICATIONS WITH SIPS OF WATER:  Ubrogepant  topiramate (TOPAMAX)  gabapentin (NEURONTIN)  fluticasone (FLONASE)   No Alcohol for 24 hours before or after surgery.  No Smoking including e-cigarettes for  24 hours before surgery.  No chewable tobacco products for at least 6 hours before surgery.  No nicotine patches on the day of surgery.  Do not use any "recreational" drugs for at least a week (preferably 2 weeks) before your surgery.  Please be advised that the combination of cocaine and anesthesia may have negative outcomes, up to and including death. If you test positive for cocaine, your surgery will be cancelled.  On the morning of surgery brush your teeth with toothpaste and water, you may rinse your mouth with mouthwash if you wish. Do not swallow any toothpaste or mouthwash.  Use CHG Soap or wipes as directed on instruction sheet.  Do not wear jewelry, make-up, hairpins, clips or nail polish.  For welded (permanent) jewelry: bracelets, anklets, waist bands, etc.  Please have this removed prior to surgery.  If it is not removed, there is a chance that hospital personnel will need to cut it off on the day of surgery.  Do not wear lotions, powders, or perfumes.   Do not shave body hair from the neck down 48 hours before surgery.  Contact lenses, hearing aids and dentures may not be worn into surgery.  Do not bring valuables to the hospital. [facility] is not responsible for any missing/lost belongings or valuables.   Notify your doctor if there is any change in your medical condition (cold, fever, infection).  Wear comfortable clothing (specific to your surgery type) to the hospital.  After surgery, you can help prevent lung complications by doing breathing exercises.  Take deep breaths and cough every 1-2 hours. Your doctor may order a device called an Incentive Spirometer to help you take deep breaths.  When coughing or sneezing, hold a pillow firmly against your incision with both hands. This is called "splinting." Doing this helps protect your incision. It also decreases belly discomfort.  If you are being admitted to the hospital overnight, leave your suitcase in the car.  After surgery it may be brought to your room.  In case of increased patient census, it may be necessary for you, the patient, to continue your postoperative care in the Same Day Surgery department.  If you are being discharged the day of surgery, you will not be allowed to drive home. You will need a responsible individual to drive you home and stay with you for 24 hours after surgery.   If you are taking public transportation, you will need to have a responsible individual with you.  Please call the Pre-admissions Testing Dept. at [phone] if you have any questions about these instructions.  Surgery Visitation Policy:  Patients having surgery or a procedure may have two visitors.  Children under the age of 49 must have an adult with them who is not the patient.  Inpatient Visitation:    Visiting hours are 7 a.m. to 8 p.m. Up to four visitors are allowed at one time in a patient room. The visitors may rotate out with other people during the day.  One visitor age 32 or older may stay with the patient overnight and must be in the room by 8 p.m.     Preparing for Surgery with CHLORHEXIDINE GLUCONATE (CHG) Soap  Chlorhexidine Gluconate (CHG) Soap  o An antiseptic cleaner that kills germs and bonds with the skin to continue killing germs even after washing  o Used for showering the night before surgery and morning of surgery  Before surgery, you can play an important role by reducing the number of germs on your skin.  CHG (Chlorhexidine gluconate) soap is an antiseptic cleanser which kills germs and bonds with the skin to continue killing germs even after washing.  Please do not use if you have an allergy to CHG or antibacterial soaps. If your skin becomes reddened/irritated stop using the CHG.  1. Shower the NIGHT BEFORE SURGERY and the MORNING OF SURGERY with CHG soap.  2. If you choose to wash your hair, wash your hair first as usual with your normal shampoo.  3. After  shampooing, rinse your hair and body thoroughly to remove the shampoo.  4. Use CHG as you would any other liquid soap. You can apply CHG directly to the skin and wash gently with a scrungie or a clean washcloth.  5. Apply the CHG soap to your body only from the neck down. Do not use on open wounds or open sores. Avoid contact with your eyes, ears, mouth, and genitals (private parts). Wash face and genitals (private parts) with your normal soap.  6. Wash thoroughly, paying special attention to the area where your surgery will be performed.  7. Thoroughly rinse your body with warm water.  8. Do not shower/wash with your normal soap after using and rinsing off the CHG soap.  9. Pat yourself dry with a clean towel.  10. Wear clean pajamas to bed the night before surgery.  12. Place clean sheets on your bed the night of your first shower and do not sleep with pets.  13. Shower again with the CHG soap  on the day of surgery prior to arriving at the hospital.  14. Do not apply any deodorants/lotions/powders.  15. Please wear clean clothes to the hospital.

## 2023-07-31 ENCOUNTER — Encounter: Payer: Self-pay | Admitting: Surgery

## 2023-07-31 ENCOUNTER — Other Ambulatory Visit: Payer: Self-pay

## 2023-07-31 ENCOUNTER — Encounter
Admission: RE | Disposition: A | Discharge status: Home / Self Care | Payer: Self-pay | Source: Home / Self Care | Attending: Surgery

## 2023-07-31 ENCOUNTER — Ambulatory Visit
Admission: RE | Admit: 2023-07-31 | Discharge: 2023-07-31 | Disposition: A | Discharge status: Home / Self Care | Attending: Surgery | Admitting: Surgery

## 2023-07-31 ENCOUNTER — Ambulatory Visit: Payer: Self-pay | Admitting: Anesthesiology

## 2023-07-31 DIAGNOSIS — M7501 Adhesive capsulitis of right shoulder: Secondary | ICD-10-CM | POA: Insufficient documentation

## 2023-07-31 DIAGNOSIS — Z79899 Other long term (current) drug therapy: Secondary | ICD-10-CM | POA: Diagnosis not present

## 2023-07-31 DIAGNOSIS — R519 Headache, unspecified: Secondary | ICD-10-CM | POA: Insufficient documentation

## 2023-07-31 DIAGNOSIS — Z9889 Other specified postprocedural states: Secondary | ICD-10-CM | POA: Insufficient documentation

## 2023-07-31 HISTORY — PX: SHOULDER CLOSED REDUCTION: SHX1051

## 2023-07-31 SURGERY — MANIPULATION, JOINT, SHOULDER, WITH ANESTHESIA
Anesthesia: General | Site: Shoulder | Laterality: Right

## 2023-07-31 MED ORDER — ACETAMINOPHEN 10 MG/ML IV SOLN
1000.0000 mg | Freq: Once | INTRAVENOUS | Status: DC | PRN
  Administered 2023-07-31: 1000 mg via INTRAVENOUS

## 2023-07-31 MED ORDER — METOCLOPRAMIDE HCL 5 MG/ML IJ SOLN: 5.0000 mg | Freq: Three times a day (TID) | INTRAMUSCULAR | Status: DC | PRN

## 2023-07-31 MED ORDER — OXYCODONE HCL 5 MG PO TABS
5.0000 mg | ORAL_TABLET | Freq: Once | ORAL | Status: AC | PRN
  Administered 2023-07-31: 5 mg via ORAL

## 2023-07-31 MED ORDER — FENTANYL CITRATE (PF) 100 MCG/2ML IJ SOLN
INTRAMUSCULAR | Status: AC
Start: 2023-07-31 — End: ?
  Filled 2023-07-31: qty 2

## 2023-07-31 MED ORDER — CHLORHEXIDINE GLUCONATE 0.12 % MT SOLN
OROMUCOSAL | Status: AC
  Filled 2023-07-31: qty 15

## 2023-07-31 MED ORDER — BUPIVACAINE-EPINEPHRINE (PF) 0.25% -1:200000 IJ SOLN
INTRAMUSCULAR | Status: AC
  Filled 2023-07-31: qty 30

## 2023-07-31 MED ORDER — ORAL CARE MOUTH RINSE: 15.0000 mL | Freq: Once | OROMUCOSAL | Status: AC

## 2023-07-31 MED ORDER — OXYCODONE HCL 5 MG/5ML PO SOLN: 5.0000 mg | Freq: Once | ORAL | Status: AC | PRN

## 2023-07-31 MED ORDER — MIDAZOLAM HCL 2 MG/2ML IJ SOLN
INTRAMUSCULAR | Status: AC
  Filled 2023-07-31: qty 2

## 2023-07-31 MED ORDER — DEXMEDETOMIDINE HCL IN NACL 80 MCG/20ML IV SOLN
INTRAVENOUS | Status: AC
  Filled 2023-07-31: qty 20

## 2023-07-31 MED ORDER — METOCLOPRAMIDE HCL 10 MG PO TABS: 5.0000 mg | ORAL_TABLET | Freq: Three times a day (TID) | ORAL | Status: DC | PRN

## 2023-07-31 MED ORDER — LIDOCAINE HCL (CARDIAC) PF 100 MG/5ML IV SOSY
PREFILLED_SYRINGE | INTRAVENOUS | Status: DC | PRN
  Administered 2023-07-31: 80 mg via INTRAVENOUS

## 2023-07-31 MED ORDER — PROPOFOL 10 MG/ML IV BOLUS
INTRAVENOUS | Status: AC
  Filled 2023-07-31: qty 20

## 2023-07-31 MED ORDER — TRIAMCINOLONE ACETONIDE 40 MG/ML IJ SUSP
INTRAMUSCULAR | Status: DC | PRN
  Administered 2023-07-31: 10 mL via SURGICAL_CAVITY

## 2023-07-31 MED ORDER — KETOROLAC TROMETHAMINE 30 MG/ML IJ SOLN
30.0000 mg | Freq: Once | INTRAMUSCULAR | Status: AC
  Administered 2023-07-31: 30 mg via INTRAVENOUS

## 2023-07-31 MED ORDER — FENTANYL CITRATE (PF) 100 MCG/2ML IJ SOLN
INTRAMUSCULAR | Status: AC
  Filled 2023-07-31: qty 2

## 2023-07-31 MED ORDER — TRIAMCINOLONE ACETONIDE 40 MG/ML IJ SUSP
INTRAMUSCULAR | Status: AC
  Filled 2023-07-31: qty 1

## 2023-07-31 MED ORDER — ONDANSETRON HCL 4 MG/2ML IJ SOLN: 4.0000 mg | Freq: Four times a day (QID) | INTRAMUSCULAR | Status: DC | PRN

## 2023-07-31 MED ORDER — LACTATED RINGERS IV SOLN
INTRAVENOUS | Status: DC
Start: 2023-07-31 — End: 2023-07-31

## 2023-07-31 MED ORDER — FENTANYL CITRATE (PF) 100 MCG/2ML IJ SOLN
25.0000 ug | INTRAMUSCULAR | Status: DC | PRN
  Administered 2023-07-31 (×4): 50 ug via INTRAVENOUS

## 2023-07-31 MED ORDER — KETOROLAC TROMETHAMINE 30 MG/ML IJ SOLN
INTRAMUSCULAR | Status: AC
  Filled 2023-07-31: qty 1

## 2023-07-31 MED ORDER — HYDROCODONE-ACETAMINOPHEN 5-325 MG PO TABS: 1.0000 | ORAL_TABLET | ORAL | Status: DC | PRN

## 2023-07-31 MED ORDER — MORPHINE SULFATE (PF) 2 MG/ML IV SOLN: 0.5000 mg | INTRAVENOUS | Status: DC | PRN

## 2023-07-31 MED ORDER — CHLORHEXIDINE GLUCONATE 0.12 % MT SOLN
15.0000 mL | Freq: Once | OROMUCOSAL | Status: AC
  Administered 2023-07-31: 15 mL via OROMUCOSAL

## 2023-07-31 MED ORDER — ACETAMINOPHEN 10 MG/ML IV SOLN
INTRAVENOUS | Status: AC
  Filled 2023-07-31: qty 100

## 2023-07-31 MED ORDER — DEXMEDETOMIDINE HCL IN NACL 80 MCG/20ML IV SOLN
INTRAVENOUS | Status: DC | PRN
  Administered 2023-07-31: 8 ug via INTRAVENOUS

## 2023-07-31 MED ORDER — ACETAMINOPHEN 325 MG PO TABS: 325.0000 mg | ORAL_TABLET | Freq: Four times a day (QID) | ORAL | Status: DC | PRN

## 2023-07-31 MED ORDER — OXYCODONE HCL 5 MG PO TABS
ORAL_TABLET | ORAL | Status: AC
  Filled 2023-07-31: qty 1

## 2023-07-31 MED ORDER — PROPOFOL 10 MG/ML IV BOLUS
INTRAVENOUS | Status: DC | PRN
  Administered 2023-07-31: 60 mg via INTRAVENOUS

## 2023-07-31 MED ORDER — SODIUM CHLORIDE 0.9 % IV SOLN: INTRAVENOUS | Status: DC

## 2023-07-31 MED ORDER — HYDROCODONE-ACETAMINOPHEN 5-325 MG PO TABS: 1.0000 | ORAL_TABLET | Freq: Four times a day (QID) | ORAL | 0 refills | Status: AC | PRN

## 2023-07-31 MED ORDER — ONDANSETRON HCL 4 MG/2ML IJ SOLN: 4.0000 mg | Freq: Once | INTRAMUSCULAR | Status: DC | PRN

## 2023-07-31 MED ORDER — ONDANSETRON HCL 4 MG PO TABS: 4.0000 mg | ORAL_TABLET | Freq: Four times a day (QID) | ORAL | Status: DC | PRN

## 2023-07-31 MED ORDER — FENTANYL CITRATE (PF) 100 MCG/2ML IJ SOLN
INTRAMUSCULAR | Status: DC | PRN
  Administered 2023-07-31: 50 ug via INTRAVENOUS

## 2023-07-31 SURGICAL SUPPLY — 10 items
BNDG ADH 1X3 SHEER STRL LF (GAUZE/BANDAGES/DRESSINGS) IMPLANT
KIT TURNOVER KIT A (KITS) ×1 IMPLANT
NDL HYPO 21X1.5 SAFETY (NEEDLE) IMPLANT
NDL SAFETY ECLIPSE 18X1.5 (NEEDLE) IMPLANT
NEEDLE HYPO 21X1.5 SAFETY (NEEDLE) ×1 IMPLANT
PAD ALCOHOL SWAB (MISCELLANEOUS) ×2 IMPLANT
SLING ARM LRG DEEP (SOFTGOODS) ×1 IMPLANT
SLING ARM M TX990204 (SOFTGOODS) ×1 IMPLANT
SOLUTION PREP PVP 2OZ (MISCELLANEOUS) ×1 IMPLANT
SYR 10ML LL (SYRINGE) ×1 IMPLANT

## 2023-07-31 NOTE — Transfer of Care (Signed)
 Immediate Anesthesia Transfer of Care Note  Patient: Faith Hall  Procedure(s) Performed: MANIPULATION, JOINT, SHOULDER, WITH ANESTHESIA (Right: Shoulder)  Patient Location: PACU  Anesthesia Type:General  Level of Consciousness: awake, drowsy, and patient cooperative  Airway & Oxygen Therapy: Patient Spontanous Breathing  Post-op Assessment: Report given to RN and Post -op Vital signs reviewed and stable  Post vital signs: Reviewed and stable  Last Vitals:  Vitals Value Taken Time  BP 91/55 07/31/23 0830  Temp    Pulse 68 07/31/23 0831  Resp 15 07/31/23 0831  SpO2 98 % 07/31/23 0831  Vitals shown include unfiled device data.  Last Pain:  Vitals:   07/31/23 0643  PainSc: 7          Complications: No notable events documented.

## 2023-07-31 NOTE — H&P (Signed)
 History of Present Illness:  Faith Hall is a 48 y.o. female who presents today as a result of a call to our clinic for right shoulder pain. The patient is now 3 months status post a rotator cuff repair procedure done by Dr. Lorenz Romano at [facility].  The patient's right shoulder symptoms first developed about 5 months ago. She went to [facility]'s urgent care clinic where she was diagnosed with biceps tendinitis and referred to one of their orthopedic surgeons. She underwent a steroid injection which provided temporary partial relief of her symptoms but only lasted 1 to 2 weeks. Therefore, an MRI scan was obtained which demonstrated evidence of significant bursitis, tendinopathy without full-thickness rotator cuff tear, and biceps tendinitis. After options were discussed, the patient elected to proceed with a right shoulder arthroscopy with debridement, decompression, and a sub-pec biceps tenodesis. Postoperatively, the patient was started in physical therapy. However, she has continued to note significant pain in her shoulder as well as difficulty raising her arm even to shoulder level despite these modalities. Recently, she had a follow-up visit with her surgeon who offered her steroid injection and advised her to continue with her exercises. She felt as though her concerns were not being addressed and has elected to seek a second opinion at this office.   The patient describes the symptoms as marked (major pain with significant limitations) and have the quality of being aching, constant, miserable, nagging, stabbing, tender, and throbbing. The pain is localized to the lateral arm/shoulder and localized to the anterior shoulder. She also has pain in the posterior aspect of her shoulder. These symptoms are aggravated constantly, with normal daily activities, with sleeping, carrying heavy objects, at higher levels of activity, with overhead activity, reaching behind the back, getting dressed, and activity in  general. She has tried Neurontin, resveratrol, bromelain, narcotics , muscle relaxants , and trazodone , all with limited benefit. She has tried physical therapy , rest , ice , and activity modification with no substantial relief of her symptoms. The patient denies any fevers or chills, noticing no new numbness or paresthesias down her arm to her hand. This complaint is not work related. She is a sports non-participant.  Shoulder Surgical History:  The patient has had a rotator cuff repair and a decompression in the past.  PMH/PSH/Family History/Social History/Meds/Allergies:  I have reviewed past medical, surgical, social and family history, medications and allergies as documented in the EMR.  Current Outpatient Medications  docusate (COLACE) 100 MG capsule Take 1 capsule twice a day by oral route, for CONSTIPATION.  traMADoL (ULTRAM) 50 mg tablet Take One tab PO Q6 hours PRN pain  acetaminophen (TYLENOL) 500 MG tablet Take 1,000 mg by mouth 3 (three) times daily Due to shoulder surgery  cholecalciferol (VITAMIN D3) 2,000 unit capsule Take 2,000 Units by mouth once daily  cyanocobalamin (VITAMIN B12) 1000 MCG tablet Take 1,000 mcg by mouth once daily  FA/mv,Ca,iron,min/lycopene/lut (MULTIVITAL ORAL) Take by mouth once daily  folic acid (FOLVITE) 1 MG tablet Take 1 mg by mouth once daily  gabapentin (NEURONTIN) 400 MG capsule Take 400mg  4-5 times a day as needed. 450 capsule 1  Herbal Supplement Take by mouth once daily Herbal Name: Nutrafol hair supplement (Patient not taking: Reported on 02/05/2023)  MAGNESIUM GLYCINATE ORAL Take by mouth once daily Dose unknown  methocarbamoL (ROBAXIN) 500 MG tablet Take 1 tablet by mouth at bedtime  onabotulinumtoxinA (BOTOX) 200 unit SolR INJECT INTO MULTIPLE AREAS OF HEAD AND NECK BY PROVIDER, DISCARD UNUSED  PORTION (MUST RECONSTITUTE) 1 each 1  ondansetron (ZOFRAN-ODT) 4 MG disintegrating tablet PLACE 1 TABLET EVERY 6-8 HOURS BY TRANSLINGUAL ROUTE AS  NEEDED, FOR NAUSEA.  propranoloL (INDERAL) 20 MG tablet as directed  scopolamine (TRANSDERM-SCOP) 1 mg over 3 days patch PLACE 1 PATCH (1 MG TOTAL) ONTO THE SKIN EVERY THIRD DAY FOR 30 DAYS  topiramate (TOPAMAX) 25 MG tablet Take 2 tablets by mouth at bedtime  topiramate (TOPAMAX) 50 MG tablet take 50mg  nightly 90 tablet 1  traZODone (DESYREL) 50 MG tablet Take 25 to 50 mg nightly for sleep 30 tablet 2  ubrogepant 100 mg Tab Take 100 mg by mouth as needed Take 50-100mg  at headache onset. Can repeat after 2 hours if needed. Do not exceed 200mg  in 24 hours. 16 tablet 5   Allergies:  Doxycycline Itching  Amoxicillin Rash  Nsaids - Other (S/P Gastric Sleeve)  Penicillin Rash   Past Medical History:  Migraine headache 10 years ago (multiple kinds)  Migraines  PCOS (polycystic ovarian syndrome)   Past Surgical History:  HYSTERECTOMY 2003  LSH without BSO  Gastric sleeve 08/2015  COLONOSCOPY 08/21/2022 (Normal/Rpt27yrs/SMR)  TONSILLECTOMY & ADENOIDECTOMY   Family History:  Diabetes Mother Air cabin crew  Migraines Mother Ludivina Safe  Multiple sclerosis Mother Ludivina Safe  Neuropathy Mother Ludivina Safe  Cancer Father Barba Levin  Protate Cancer  Colon cancer Maternal Grandfather  Breast cancer Paternal Grandmother Christian Cowden  Uterine cancer Paternal Grandmother Christian Cowden  Cancer Paternal Grandmother Ruth Thomazin  Breast and ovarian and lung  Diabetes Paternal Grandmother Ruth Thomazin  Hyperlipidemia (Elevated cholesterol) Paternal Grandmother Christian Cowden  Cancer Paternal Grandfather Mose Arena  Lung cancer  High blood pressure (Hypertension) Paternal Grandfather Mose Arena  Stroke Paternal Grandfather Mose Arena  Dementia Maternal Grandmother Kandice Orleans  Dementia   Social History:   Socioeconomic History:  Marital status: Married  Tobacco Use  Smoking status: Never  Passive exposure: Yes  Smokeless tobacco: Never  Vaping Use   Vaping status: Never Used  Substance and Sexual Activity  Alcohol use: No  Drug use: Never  Sexual activity: Yes  Partners: Male  Birth control/protection: Surgical   Social Drivers of Health:   Financial Resource Strain: Patient Declined (03/31/2023)  Overall Financial Resource Strain (CARDIA)  Difficulty of Paying Living Expenses: Patient declined  Food Insecurity: Patient Declined (03/31/2023)  Hunger Vital Sign  Worried About Running Out of Food in the Last Year: Patient declined  Ran Out of Food in the Last Year: Patient declined  Transportation Needs: No Transportation Needs (03/31/2023)  PRAPARE - Risk analyst (Medical): No  Lack of Transportation (Non-Medical): No   Review of Systems:  A comprehensive 14 point ROS was performed, reviewed, and the pertinent orthopaedic findings are documented in the HPI.  Physical Exam:  Vitals:  07/22/23 1003  BP: 106/76  Weight: 61.4 kg (135 lb 6.4 oz)  Height: 165.1 cm (5\' 5" )  PainSc: 8  PainLoc: Shoulder   General/Constitutional: The patient appears to be well-nourished, well-developed, and in no acute distress. Neuro/Psych: Normal mood and affect, oriented to person, place and time. Eyes: Non-icteric. Pupils are equal, round, and reactive to light, and exhibit synchronous movement. ENT: Unremarkable. Lymphatic: No palpable adenopathy. Respiratory: Lungs clear to auscultation, Normal chest excursion, No wheezes, and Non-labored breathing Cardiovascular: Regular rate and rhythm. No murmurs. and No edema, swelling or tenderness, except as noted in detailed exam. Integumentary: No impressive skin lesions present, except as noted in detailed exam. Musculoskeletal: Unremarkable,  except as noted in detailed exam.  Right shoulder exam: SKIN: Well-healed arthroscopic portal sites and surgical incision, otherwise unremarkable. SWELLING: none WARMTH: none LYMPH NODES: no adenopathy palpable CREPITUS:  none TENDERNESS: Moderately tender over anterolateral shoulder, mildly tender over posterior shoulder ROM (active):  Forward flexion: 90 degrees Abduction: 75 degrees Internal rotation: Right buttock ROM (passive):  Forward flexion: 110 degrees Abduction: 95 degrees  ER/IR at 90 abd: 60 degrees / 50 degrees  She experiences moderate to severe pain with all motions.  STRENGTH: Forward flexion: 3/5 Abduction: 3/5 External rotation: 3+/5 Internal rotation: 4/5 Pain with RC testing: Moderate-severe pain with resisted strength testing in all planes.  STABILITY: Normal  SPECIAL TESTS: Zoila Hines' test: Not evaluated Speed's test: not evaluated Capsulitis - pain w/ passive ER: yes Crossed arm test: Not evaluated Crank: Not evaluated Anterior apprehension: Negative Posterior apprehension: Not evaluated  She is neurovascularly intact to the right upper extremity.  Imaging:  True AP, Y-scapular, and axillary views of the right shoulder are obtained. These films demonstrate no evidence for fractures, lytic lesions, or significant degenerative changes. The subacromial space is mildly decreased. There is no subacromial or infra-clavicular spurring. She demonstrates a Type I acromion.  Assessment:  1. Adhesive capsulitis of right shoulder.  2. Rotator cuff tendinitis, right.   Plan:  The treatment options were discussed with the patient and her husband. In addition, patient educational materials were provided regarding the diagnosis and treatment options. The patient is quite frustrated by her symptoms and functional limitations, and is ready to consider more aggressive treatment options. Based on her history and examination findings, I feel that she is in the process of developing adhesive capsulitis. As a precaution, I have asked Dr. Windle Hatch to evaluate the patient's shoulder under ultrasound guidance to see be sure that those rotator cuff tears have materialized since her surgery,  especially as she recalls 1 particular therapy session with a different therapist in which her symptoms worsened after the session. Although his formal report is not yet available, verbally Dr. Windle Hatch has assured me that there is no obvious rotator cuff pathology identified by ultrasound.   This diagnosis was discussed in detail, as were the potential treatment options ranging from doing nothing to resuming physical therapy and performing a steroid injection to considering a manipulation under anesthesia with steroid injection. After discussing these options, the patient would like to proceed with surgical invention to include a manipulation under anesthesia with intra-articular steroid injection. The procedure was discussed with the patient, as were the potential risks (including bleeding, infection, nerve and/or blood vessel injury, persistent or recurrent pain, recurrent stiffness of the shoulder, humerus fracture, need for further surgery, blood clots, strokes, heart attacks and/or arhythmias, pneumonia, etc.) and benefits. The patient states her understanding and wishes to proceed. All of the patient's questions and concerns were answered. She can call any time with further concerns. She will follow up post-surgery, routine.    H&P reviewed and patient re-examined. No changes.

## 2023-07-31 NOTE — Anesthesia Postprocedure Evaluation (Signed)
 Anesthesia Post Note  Patient: Faith Hall  Procedure(s) Performed: MANIPULATION, JOINT, SHOULDER, WITH ANESTHESIA (Right: Shoulder)  Patient location during evaluation: PACU Anesthesia Type: General Level of consciousness: awake and alert Pain management: pain level controlled Vital Signs Assessment: post-procedure vital signs reviewed and stable Respiratory status: spontaneous breathing, nonlabored ventilation, respiratory function stable and patient connected to nasal cannula oxygen Cardiovascular status: blood pressure returned to baseline and stable Postop Assessment: no apparent nausea or vomiting Anesthetic complications: no   No notable events documented.   Last Vitals:  Vitals:   07/31/23 0927 07/31/23 0936  BP: 97/66 (!) 104/56  Pulse: 60 (!) 58  Resp: 16 15  Temp: 36.7 C 36.8 C  SpO2: 100% 100%    Last Pain:  Vitals:   07/31/23 0936  TempSrc: Temporal  PainSc: 4                  Lattie Poli

## 2023-07-31 NOTE — Op Note (Signed)
 07/31/2023  8:26 AM  Patient:   Faith Hall  Pre-Op Diagnosis:   Secondary adhesive capsulitis, right shoulder.  Post-Op Diagnosis:   Same  Procedure:   Manipulation under anesthesia with steroid injection, right shoulder.  Surgeon:   Lonnie Roberts, MD  Assistant:   None  Anesthesia:   IV sedation  Findings:   As above. Prior to manipulation, the right shoulder could be forward flexed to 145 and abducted to 135. At 90 of abduction, the shoulder could be externally rotated to 65 and internally rotated to 55. Following manipulation, the shoulder could be forward flexed to 170, abducted to 165 and, at 90 of abduction, externally rotated to 95 and internally rotated to 65.  Complications:   None  EBL:   0 cc  Fluids:   50 cc crystalloid  TT:   None  Drains:   None  Closure:   None  Brief Clinical Note:   The patient is a 48 year old female who is now 3+ months status post a right shoulder arthroscopy with debridement, decompression, and subpectoral biceps tenodesis by an outside Careers adviser. Despite extensive physical therapy, the patient continues to have difficulty regaining shoulder range of motion. The patient's history and examination are consistent with secondary adhesive capsulitis. The patient presents at this time for a manipulation under anesthesia with steroid injection of the right shoulder.  Procedure:   The patient was brought into the operating room and lain in the supine position. After adequate IV sedation was achieved, a timeout was performed to verify the correct surgical site. The right shoulder was gently manipulated in both abduction and external rotation, as well as adduction and internal rotation. Several palpable and audible pops were heard as the scar tissue released, permitting full range of motion of the shoulder. The glenohumeral joint was injected sterilely using 1 cc of Kenalog-40 and 9 cc of 0.5% Sensorcaine with epinephrine before the patient  was placed into a sling. The patient was then awakened and returned to the recovery room in satisfactory condition after tolerating the procedure well.

## 2023-07-31 NOTE — Discharge Instructions (Addendum)
 Orthopedic discharge instructions: May shower immediately. Apply ice frequently to shoulder. Take hydrocodone as prescribed when needed.  May supplement with ES Tylenol if necessary. Use shoulder sling as necessary for first 24 hours. Start formal physical therapy as scheduled tomorrow. Follow-up in 10-14 days or as scheduled.

## 2023-07-31 NOTE — Anesthesia Preprocedure Evaluation (Signed)
 Anesthesia Evaluation  Patient identified by MRN, date of birth, ID band Patient awake    Reviewed: Allergy & Precautions, NPO status , Patient's Chart, lab work & pertinent test results  History of Anesthesia Complications Negative for: history of anesthetic complications  Airway Mallampati: II  TM Distance: >3 FB Neck ROM: Full    Dental no notable dental hx. (+) Teeth Intact   Pulmonary neg sleep apnea, neg COPD, Current Smoker and Patient abstained from smoking.   Pulmonary exam normal breath sounds clear to auscultation       Cardiovascular Exercise Tolerance: Good METS(-) hypertension(-) CAD and (-) Past MI negative cardio ROS (-) dysrhythmias  Rhythm:Regular Rate:Normal - Systolic murmurs    Neuro/Psych  Headaches  negative psych ROS   GI/Hepatic ,neg GERD  ,,(+)     (-) substance abuse    Endo/Other  neg diabetes    Renal/GU negative Renal ROS     Musculoskeletal   Abdominal   Peds  Hematology   Anesthesia Other Findings Past Medical History: No date: Abnormal endocrine laboratory test finding No date: Adhesive capsulitis of right shoulder No date: Blurred vision No date: Brain fog No date: Breast mass in female No date: Cancer (HCC)     Comment:  cervical ca No date: Dyspnea No date: Fatigue, unspecified type No date: Female hirsutism No date: Intractable chronic migraine with aura with status  migrainosus No date: Intractable hemiplegic migraine with status migrainosus No date: Loss of peripheral visual field, bilateral No date: Palpitations No date: Right shoulder pain No date: Rotator cuff tendinitis, right No date: Sleep difficulties No date: SOBOE (shortness of breath on exertion)  Reproductive/Obstetrics                             Anesthesia Physical Anesthesia Plan  ASA: 2  Anesthesia Plan: General   Post-op Pain Management: Ofirmev IV (intra-op)*    Induction: Intravenous  PONV Risk Score and Plan: 2 and Ondansetron, TIVA, Midazolam and Dexamethasone  Airway Management Planned: Mask  Additional Equipment: None  Intra-op Plan:   Post-operative Plan:   Informed Consent: I have reviewed the patients History and Physical, chart, labs and discussed the procedure including the risks, benefits and alternatives for the proposed anesthesia with the patient or authorized representative who has indicated his/her understanding and acceptance.     Dental advisory given  Plan Discussed with: CRNA and Surgeon  Anesthesia Plan Comments: (Discussed risks of anesthesia with patient, including PONV, muscle aches. Rare risks discussed as well, such as cardiorespiratory sequelae, need for airway intervention and its associated risks including lip/dental/eye damage and sore throat, and allergic reactions. Discussed the role of CRNA in patient's perioperative care. Patient understands. Discussed r/b/a of interscalene block for analgesia, and patient ultimately declined.)       Anesthesia Quick Evaluation

## 2023-07-31 NOTE — Anesthesia Procedure Notes (Signed)
 Procedure Name: MAC Date/Time: 07/31/2023 8:15 AM  Performed by: Orin Birk, CRNAPre-anesthesia Checklist: Emergency Drugs available, Patient identified, Suction available and Patient being monitored Patient Re-evaluated:Patient Re-evaluated prior to induction Oxygen Delivery Method: Nasal cannula

## 2023-08-01 ENCOUNTER — Encounter: Payer: Self-pay | Admitting: Surgery

## 2023-09-17 ENCOUNTER — Other Ambulatory Visit: Payer: Self-pay | Admitting: Obstetrics and Gynecology

## 2023-09-17 DIAGNOSIS — Z1231 Encounter for screening mammogram for malignant neoplasm of breast: Secondary | ICD-10-CM

## 2023-09-26 ENCOUNTER — Other Ambulatory Visit: Payer: Self-pay | Admitting: Medical Genetics

## 2023-10-02 ENCOUNTER — Other Ambulatory Visit
Admission: RE | Admit: 2023-10-02 | Discharge: 2023-10-02 | Disposition: A | Discharge status: Home / Self Care | Payer: Self-pay | Source: Ambulatory Visit | Attending: Medical Genetics | Admitting: Medical Genetics

## 2023-10-03 ENCOUNTER — Ambulatory Visit
Admission: RE | Admit: 2023-10-03 | Discharge: 2023-10-03 | Disposition: A | Discharge status: Home / Self Care | Source: Ambulatory Visit | Attending: Obstetrics and Gynecology | Admitting: Obstetrics and Gynecology

## 2023-10-03 DIAGNOSIS — Z1231 Encounter for screening mammogram for malignant neoplasm of breast: Secondary | ICD-10-CM | POA: Insufficient documentation

## 2023-10-12 LAB — GENECONNECT MOLECULAR SCREEN: Genetic Analysis Overall Interpretation: NEGATIVE

## 2023-12-20 ENCOUNTER — Other Ambulatory Visit: Payer: Self-pay | Admitting: Physician Assistant

## 2023-12-20 DIAGNOSIS — H539 Unspecified visual disturbance: Secondary | ICD-10-CM

## 2023-12-20 DIAGNOSIS — F8081 Childhood onset fluency disorder: Secondary | ICD-10-CM

## 2023-12-20 DIAGNOSIS — G43E19 Chronic migraine with aura, intractable, without status migrainosus: Secondary | ICD-10-CM

## 2023-12-20 DIAGNOSIS — H538 Other visual disturbances: Secondary | ICD-10-CM

## 2023-12-20 DIAGNOSIS — G43809 Other migraine, not intractable, without status migrainosus: Secondary | ICD-10-CM

## 2023-12-20 DIAGNOSIS — R202 Paresthesia of skin: Secondary | ICD-10-CM

## 2023-12-20 DIAGNOSIS — R269 Unspecified abnormalities of gait and mobility: Secondary | ICD-10-CM

## 2023-12-23 ENCOUNTER — Ambulatory Visit
Admission: RE | Admit: 2023-12-23 | Discharge: 2023-12-23 | Disposition: A | Discharge status: Home / Self Care | Source: Ambulatory Visit | Attending: Physician Assistant | Admitting: Physician Assistant

## 2023-12-23 DIAGNOSIS — R202 Paresthesia of skin: Secondary | ICD-10-CM | POA: Insufficient documentation

## 2023-12-23 DIAGNOSIS — G43E19 Chronic migraine with aura, intractable, without status migrainosus: Secondary | ICD-10-CM | POA: Diagnosis present

## 2023-12-23 DIAGNOSIS — R269 Unspecified abnormalities of gait and mobility: Secondary | ICD-10-CM | POA: Diagnosis present

## 2023-12-23 DIAGNOSIS — H538 Other visual disturbances: Secondary | ICD-10-CM | POA: Insufficient documentation

## 2023-12-23 DIAGNOSIS — H539 Unspecified visual disturbance: Secondary | ICD-10-CM | POA: Insufficient documentation

## 2023-12-23 DIAGNOSIS — F8081 Childhood onset fluency disorder: Secondary | ICD-10-CM | POA: Insufficient documentation

## 2023-12-23 DIAGNOSIS — G43809 Other migraine, not intractable, without status migrainosus: Secondary | ICD-10-CM | POA: Diagnosis present

## 2023-12-23 MED ORDER — GADOBUTROL 1 MMOL/ML IV SOLN
5.0000 mL | Freq: Once | INTRAVENOUS | Status: AC | PRN
  Administered 2023-12-23: 5 mL via INTRAVENOUS

## 2024-03-10 ENCOUNTER — Other Ambulatory Visit: Payer: Self-pay | Admitting: Student

## 2024-03-10 DIAGNOSIS — M7532 Calcific tendinitis of left shoulder: Secondary | ICD-10-CM

## 2024-03-10 DIAGNOSIS — M7582 Other shoulder lesions, left shoulder: Secondary | ICD-10-CM

## 2024-03-10 DIAGNOSIS — M7522 Bicipital tendinitis, left shoulder: Secondary | ICD-10-CM

## 2024-03-10 DIAGNOSIS — S46012A Strain of muscle(s) and tendon(s) of the rotator cuff of left shoulder, initial encounter: Secondary | ICD-10-CM

## 2024-03-10 DIAGNOSIS — M25512 Pain in left shoulder: Secondary | ICD-10-CM

## 2024-03-12 ENCOUNTER — Ambulatory Visit: Admission: RE | Admit: 2024-03-12 | Source: Ambulatory Visit

## 2024-03-12 DIAGNOSIS — M25512 Pain in left shoulder: Secondary | ICD-10-CM | POA: Insufficient documentation

## 2024-03-12 DIAGNOSIS — S46012A Strain of muscle(s) and tendon(s) of the rotator cuff of left shoulder, initial encounter: Secondary | ICD-10-CM | POA: Insufficient documentation

## 2024-03-12 DIAGNOSIS — M7532 Calcific tendinitis of left shoulder: Secondary | ICD-10-CM | POA: Insufficient documentation

## 2024-03-12 DIAGNOSIS — M7582 Other shoulder lesions, left shoulder: Secondary | ICD-10-CM | POA: Diagnosis present

## 2024-03-12 DIAGNOSIS — M7522 Bicipital tendinitis, left shoulder: Secondary | ICD-10-CM | POA: Diagnosis present
